# Patient Record
Sex: Female | Born: 1955 | Race: White | Hispanic: No | Marital: Married | Smoking: Never smoker
Health system: Southern US, Community
[De-identification: ages and names within clinical notes are randomized; demographics above are authoritative.]

## PROBLEM LIST (undated history)

## (undated) DIAGNOSIS — C801 Malignant (primary) neoplasm, unspecified: Secondary | ICD-10-CM

## (undated) DIAGNOSIS — N6019 Diffuse cystic mastopathy of unspecified breast: Secondary | ICD-10-CM

## (undated) DIAGNOSIS — Z1211 Encounter for screening for malignant neoplasm of colon: Secondary | ICD-10-CM

## (undated) DIAGNOSIS — Z87440 Personal history of urinary (tract) infections: Secondary | ICD-10-CM

## (undated) HISTORY — DX: Diffuse cystic mastopathy of unspecified breast: N60.19

## (undated) HISTORY — DX: Encounter for screening for malignant neoplasm of colon: Z12.11

## (undated) HISTORY — DX: Personal history of urinary (tract) infections: Z87.440

## (undated) HISTORY — PX: APPENDECTOMY: SHX54

## (undated) HISTORY — PX: BREAST CYST ASPIRATION: SHX578

---

## 2004-03-03 DIAGNOSIS — Z87440 Personal history of urinary (tract) infections: Secondary | ICD-10-CM

## 2004-03-03 HISTORY — DX: Personal history of urinary (tract) infections: Z87.440

## 2004-09-25 ENCOUNTER — Ambulatory Visit: Payer: Self-pay | Admitting: Unknown Physician Specialty

## 2005-06-30 ENCOUNTER — Ambulatory Visit: Payer: Self-pay | Admitting: General Surgery

## 2006-08-05 ENCOUNTER — Ambulatory Visit: Payer: Self-pay | Admitting: General Surgery

## 2007-03-04 HISTORY — PX: COLONOSCOPY: SHX174

## 2007-03-04 HISTORY — PX: CERVICAL CONE BIOPSY: SUR198

## 2007-03-04 HISTORY — PX: TOE SURGERY: SHX1073

## 2007-05-02 ENCOUNTER — Ambulatory Visit: Payer: Self-pay | Admitting: Gynecologic Oncology

## 2007-05-11 ENCOUNTER — Ambulatory Visit: Payer: Self-pay | Admitting: Gynecologic Oncology

## 2007-06-29 ENCOUNTER — Other Ambulatory Visit: Payer: Self-pay

## 2007-06-29 ENCOUNTER — Ambulatory Visit: Payer: Self-pay | Admitting: Gynecologic Oncology

## 2007-07-02 ENCOUNTER — Ambulatory Visit: Payer: Self-pay | Admitting: Gynecologic Oncology

## 2007-08-17 ENCOUNTER — Ambulatory Visit: Payer: Self-pay | Admitting: Gynecologic Oncology

## 2008-02-01 ENCOUNTER — Ambulatory Visit: Payer: Self-pay | Admitting: Gastroenterology

## 2008-06-07 IMAGING — CR DG CHEST 2V
1 series · 2 of 2 positions shown · non-contrast
Comparison: none

REASON FOR EXAM: irregular heart rate
COMMENTS:

PROCEDURE:     DXR - DXR CHEST PA (OR AP) AND LATERAL  - June 29, 2007  [DATE]
RESULT:     The lungs are mildly hyperinflated. There is no focal
infiltrate. The heart is not enlarged and the pulmonary vascularity is not
engorged. There is no pleural effusion.

[Series 1: view not recorded · 0.17mm/px · 2 of 2 slices shown]
[im 1/2]
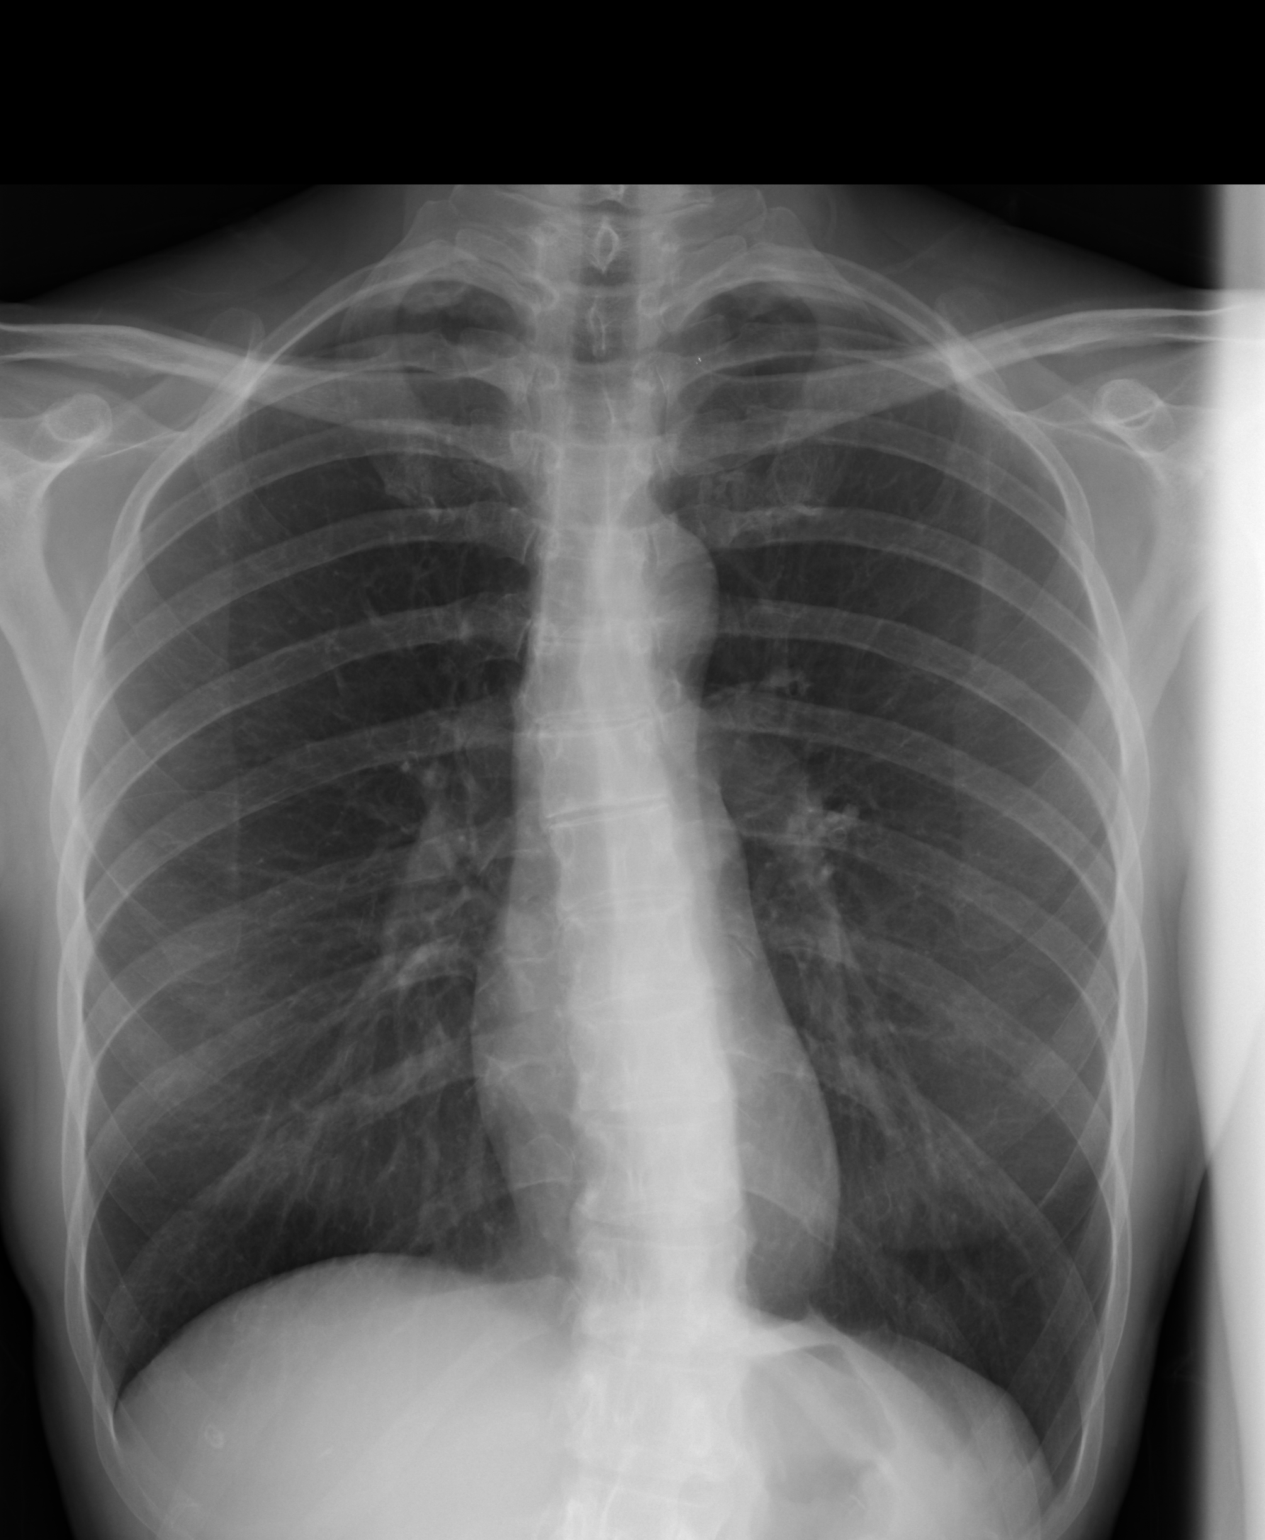
[im 2/2]
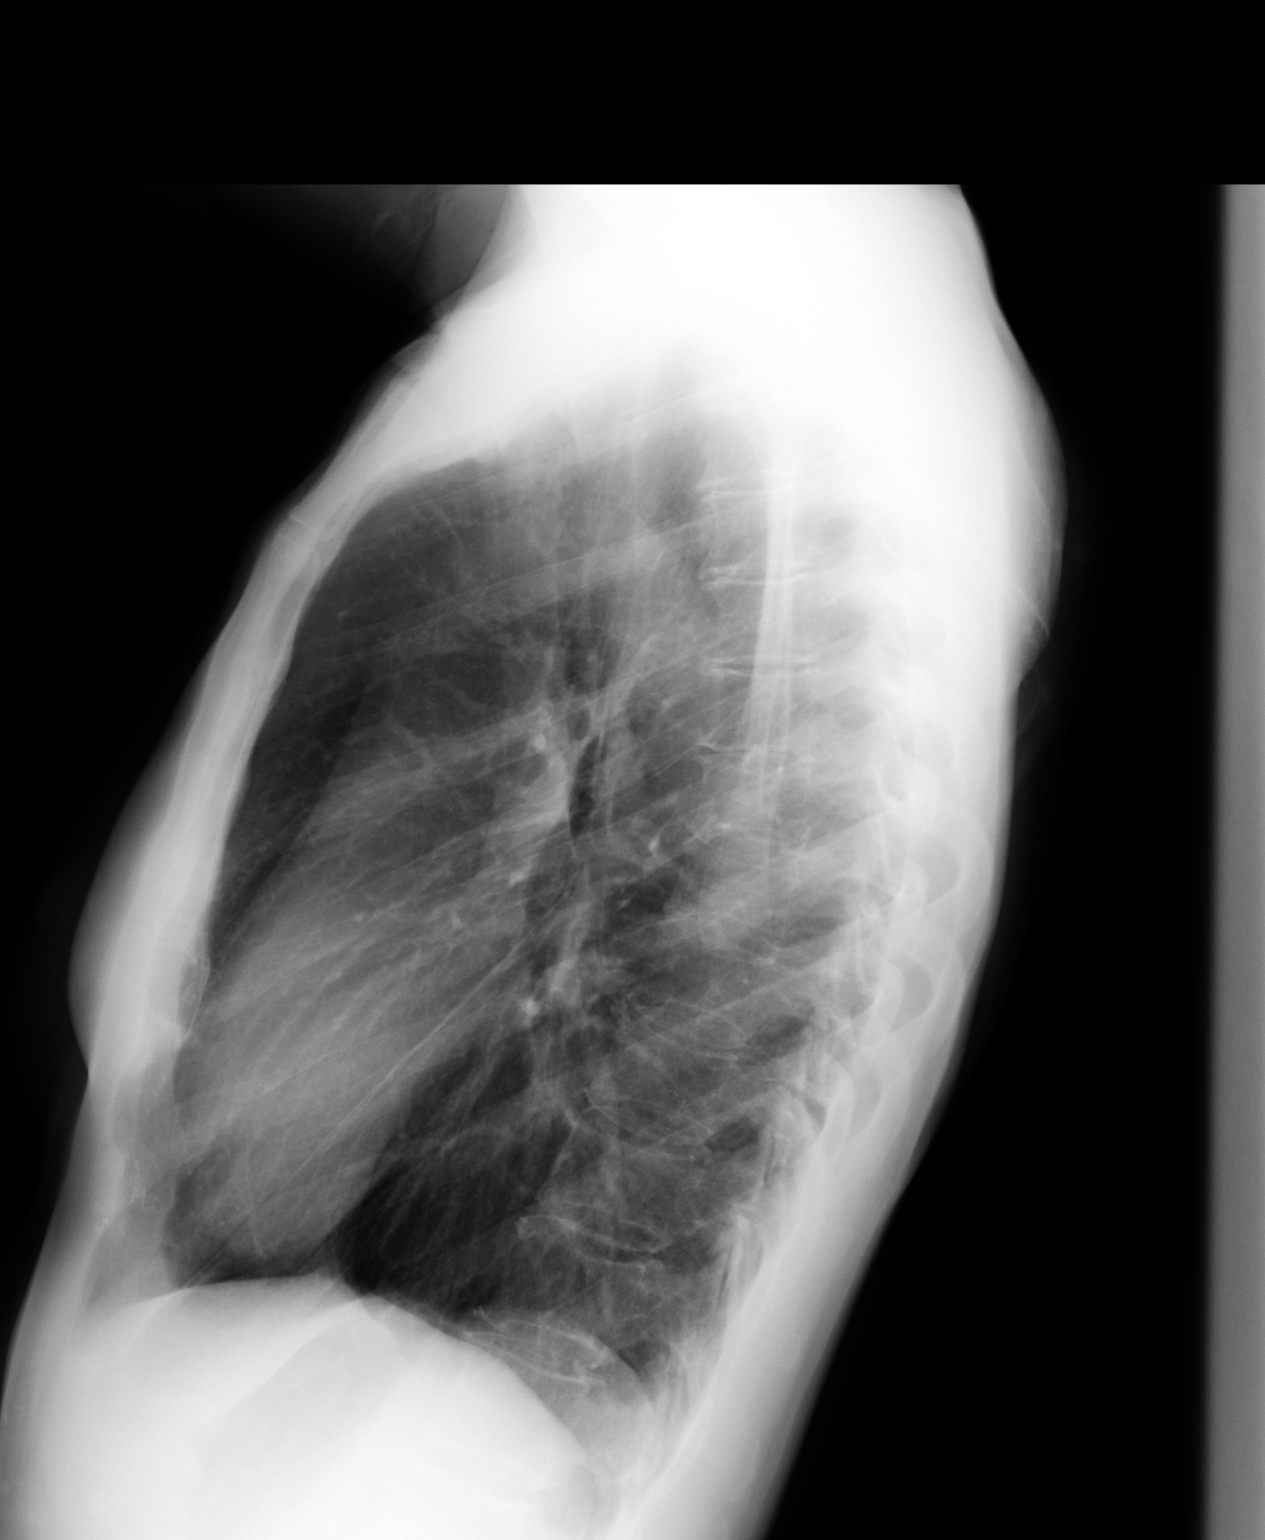

[2 of 2 positions shown; findings below may reference images not displayed]

IMPRESSION: There is hyperinflation consistent with COPD. I see no evidence of CHF nor
pneumonia.

## 2008-06-13 ENCOUNTER — Ambulatory Visit: Payer: Self-pay | Admitting: Obstetrics and Gynecology

## 2009-12-07 ENCOUNTER — Ambulatory Visit: Payer: Self-pay | Admitting: General Surgery

## 2010-03-03 HISTORY — PX: ROTATOR CUFF REPAIR: SHX139

## 2010-12-09 ENCOUNTER — Ambulatory Visit: Payer: Self-pay | Admitting: General Surgery

## 2011-12-11 ENCOUNTER — Ambulatory Visit: Payer: Self-pay | Admitting: General Surgery

## 2012-08-09 ENCOUNTER — Encounter: Payer: Self-pay | Admitting: *Deleted

## 2012-12-23 ENCOUNTER — Ambulatory Visit: Payer: Self-pay | Admitting: General Surgery

## 2013-01-11 ENCOUNTER — Ambulatory Visit: Payer: Self-pay | Admitting: General Surgery

## 2013-01-12 ENCOUNTER — Encounter: Payer: Self-pay | Admitting: General Surgery

## 2013-01-20 ENCOUNTER — Ambulatory Visit (INDEPENDENT_AMBULATORY_CARE_PROVIDER_SITE_OTHER): Payer: BC Managed Care – PPO | Admitting: General Surgery

## 2013-01-20 ENCOUNTER — Encounter: Payer: Self-pay | Admitting: General Surgery

## 2013-01-20 VITALS — BP 110/80 | HR 72 | Resp 12 | Ht 72.0 in | Wt 142.0 lb

## 2013-01-20 DIAGNOSIS — Z1239 Encounter for other screening for malignant neoplasm of breast: Secondary | ICD-10-CM

## 2013-01-20 DIAGNOSIS — N6019 Diffuse cystic mastopathy of unspecified breast: Secondary | ICD-10-CM

## 2013-01-20 NOTE — Patient Instructions (Signed)
Patient to return in 1 year with bilateral screening mammogram. Patient to continue self breast checks. She is advised to contact our office with any new questions or concerns.  

## 2013-01-20 NOTE — Progress Notes (Signed)
Patient ID: Alice Matthews, female   DOB: 07/21/1955, 57 y.o.   MRN: [id number]  Chief Complaint  Patient presents with  . Follow-up    1 year follow up screening mammogram.    HPI Alice Matthews is a 57 y.o. female who presents for a breast evaluation. The most recent mammogram was done on 01/11/13.  Patient does perform regular self breast checks and gets regular mammograms done.  The patient denies any new breast problems at this time.    HPI  Past Medical History  Diagnosis Date  . Diffuse cystic mastopathy   . H/O cystitis 2006  . Special screening for malignant neoplasms, colon     Past Surgical History  Procedure Laterality Date  . Toe surgery  2009  . Colonoscopy  2009    Dr. Servando Snare  . Cervical cone biopsy  2009  . Appendectomy    . Cesarean section    . Rotator cuff repair Left 2012    Family History  Problem Relation Age of Onset  . Cancer Mother     ulcerative colitis and colon cancer  . Lymphoma Father     non hodgkins lymphoma  . Cancer Maternal Grandmother 65    breast    Social History History  Substance Use Topics  . Smoking status: Never Smoker   . Smokeless tobacco: Not on file  . Alcohol Use: Yes     Comment: occasional glass of wine    No Known Allergies  Current Outpatient Prescriptions  Medication Sig Dispense Refill  . aspirin 81 MG tablet Take 81 mg by mouth daily.      . Cholecalciferol (VITAMIN D PO) Take 1 tablet by mouth daily.      . Cyanocobalamin (VITAMIN B-12 PO) Take 1 tablet by mouth daily.      . Omega-3 Fatty Acids (OMEGA 3 PO) Take 1 capsule by mouth daily.       No current facility-administered medications for this visit.    Review of Systems Review of Systems  Constitutional: Negative.   Respiratory: Negative.   Cardiovascular: Negative.     Blood pressure 110/80, pulse 72, resp. rate 12, height 6' (1.829 m), weight 142 lb (64.411 kg).  Physical Exam Physical Exam  Constitutional: She is oriented to  person, place, and time. She appears well-developed and well-nourished.  Eyes: Conjunctivae are normal. No scleral icterus.  Neck: Neck supple. No thyromegaly present.  Cardiovascular: Normal rate, regular rhythm and normal heart sounds.   No murmur heard. Pulmonary/Chest: Effort normal and breath sounds normal. Right breast exhibits no inverted nipple, no mass, no nipple discharge, no skin change and no tenderness. Left breast exhibits no inverted nipple, no mass, no nipple discharge, no skin change and no tenderness.  Abdominal: Soft. Bowel sounds are normal. There is no tenderness.  Lymphadenopathy:    She has no cervical adenopathy.    She has no axillary adenopathy.  Neurological: She is alert and oriented to person, place, and time.  Skin: Skin is warm and dry.    Data Reviewed Mammogram reviewed. Cat 1  Assessment    Exam stable.  History of FCD, FH of colon CA      Plan    1 yr f/u with bilateral screening mammogram       SANKAR,SEEPLAPUTHUR G 01/20/2013, 10:04 AM

## 2014-01-02 ENCOUNTER — Encounter: Payer: Self-pay | Admitting: General Surgery

## 2014-01-17 ENCOUNTER — Encounter: Payer: Self-pay | Admitting: General Surgery

## 2014-01-25 ENCOUNTER — Encounter: Payer: Self-pay | Admitting: General Surgery

## 2014-01-25 ENCOUNTER — Ambulatory Visit (INDEPENDENT_AMBULATORY_CARE_PROVIDER_SITE_OTHER): Payer: BC Managed Care – PPO | Admitting: General Surgery

## 2014-01-25 VITALS — BP 126/70 | HR 78 | Resp 12 | Ht 72.0 in | Wt 144.0 lb

## 2014-01-25 DIAGNOSIS — N6019 Diffuse cystic mastopathy of unspecified breast: Secondary | ICD-10-CM

## 2014-01-25 DIAGNOSIS — Z800 Family history of malignant neoplasm of digestive organs: Secondary | ICD-10-CM

## 2014-01-25 NOTE — Progress Notes (Signed)
Patient ID: Alice Matthews, female   DOB: 16-Jul-1955, 58 y.o.   MRN: [id number]  Chief Complaint  Patient presents with  . Follow-up    1 year mammogram     HPI Alice Matthews is a 58 y.o. female who presents for a breast evaluation. The most recent mammogram was done on 01/17/14. Patient does perform regular self breast checks and gets regular mammograms done. She states she has some mild left breast tenderness at times that seems to be new.    HPI  Past Medical History  Diagnosis Date  . Diffuse cystic mastopathy   . H/O cystitis 2006  . Special screening for malignant neoplasms, colon     Past Surgical History  Procedure Laterality Date  . Toe surgery  2009  . Colonoscopy  2009    Dr. Allen Norris  . Cervical cone biopsy  2009  . Appendectomy    . Cesarean section    . Rotator cuff repair Left 2012    Family History  Problem Relation Age of Onset  . Cancer Mother     ulcerative colitis and colon cancer  . Lymphoma Father     non hodgkins lymphoma  . Cancer Maternal Grandmother 93    breast  . Cancer Maternal Uncle     melanoma    Social History History  Substance Use Topics  . Smoking status: Never Smoker   . Smokeless tobacco: Not on file  . Alcohol Use: 0.0 oz/week    0 Not specified per week     Comment: occasional glass of wine    No Known Allergies  Current Outpatient Prescriptions  Medication Sig Dispense Refill  . aspirin 81 MG tablet Take 81 mg by mouth daily.    . Cholecalciferol (VITAMIN D PO) Take 1 tablet by mouth daily.    . Cyanocobalamin (VITAMIN B-12 PO) Take 1 tablet by mouth daily.    . Omega-3 Fatty Acids (OMEGA 3 PO) Take 1 capsule by mouth daily.     No current facility-administered medications for this visit.    Review of Systems Review of Systems  Constitutional: Negative.   Respiratory: Negative.   Cardiovascular: Negative.     Blood pressure 126/70, pulse 78, resp. rate 12, height 6' (1.829 m), weight 144 lb (65.318  kg).  Physical Exam Physical Exam  Constitutional: She is oriented to person, place, and time. She appears well-developed and well-nourished.  Eyes: Conjunctivae are normal. No scleral icterus.  Neck: Neck supple. No thyromegaly present.  Cardiovascular: Normal rate, regular rhythm and normal heart sounds.   No murmur heard. Pulmonary/Chest: Effort normal and breath sounds normal. Right breast exhibits no inverted nipple, no mass, no nipple discharge, no skin change and no tenderness. Left breast exhibits no inverted nipple, no mass, no nipple discharge, no skin change and no tenderness.  Abdominal: Soft. Normal appearance and bowel sounds are normal. There is no hepatosplenomegaly. There is no tenderness. No hernia.  Lymphadenopathy:    She has no cervical adenopathy.    She has no axillary adenopathy.  Neurological: She is alert and oriented to person, place, and time.  Skin: Skin is warm and dry.    Data Reviewed Mammogram reviewed and stable.   Assessment    Stable exam. FCD. FH of colon ca- pt over due for surveillance colonoscopy. She is aware of it and plans to get it done next yr.    Plan    Patient to return in 1 year with a bilateral  screening mammogram.        SANKAR,SEEPLAPUTHUR G 01/25/2014, 10:29 AM

## 2014-01-25 NOTE — Patient Instructions (Signed)
Patient to return in 1 year with a bilateral screening mammogram. Continue self breast exams. Call office for any new breast issues or concerns.

## 2015-01-31 ENCOUNTER — Other Ambulatory Visit: Payer: Self-pay | Admitting: General Surgery

## 2015-01-31 DIAGNOSIS — Z1231 Encounter for screening mammogram for malignant neoplasm of breast: Secondary | ICD-10-CM

## 2015-02-01 ENCOUNTER — Ambulatory Visit: Payer: Self-pay | Admitting: General Surgery

## 2015-03-13 ENCOUNTER — Encounter: Payer: Self-pay | Admitting: *Deleted

## 2015-06-01 ENCOUNTER — Other Ambulatory Visit: Payer: Self-pay | Admitting: Obstetrics and Gynecology

## 2015-06-01 DIAGNOSIS — Z13820 Encounter for screening for osteoporosis: Secondary | ICD-10-CM

## 2015-06-08 ENCOUNTER — Telehealth: Payer: Self-pay | Admitting: Gastroenterology

## 2015-06-08 ENCOUNTER — Ambulatory Visit
Admission: RE | Admit: 2015-06-08 | Discharge: 2015-06-08 | Disposition: A | Discharge status: Home / Self Care | Payer: BLUE CROSS/BLUE SHIELD | Source: Ambulatory Visit | Attending: General Surgery | Admitting: General Surgery

## 2015-06-08 DIAGNOSIS — Z1231 Encounter for screening mammogram for malignant neoplasm of breast: Secondary | ICD-10-CM | POA: Diagnosis not present

## 2015-06-08 HISTORY — DX: Malignant (primary) neoplasm, unspecified: C80.1

## 2015-06-08 NOTE — Telephone Encounter (Signed)
Colonoscopy There is a note on referral that states the patient wants you to confirm her [employer] is in network

## 2015-06-26 ENCOUNTER — Other Ambulatory Visit: Payer: Self-pay

## 2015-06-26 NOTE — Telephone Encounter (Signed)
Pt stated she would like to think about scheduling. She is not quite ready at the moment. Will call back.

## 2016-12-30 ENCOUNTER — Other Ambulatory Visit: Payer: Self-pay | Admitting: Obstetrics and Gynecology

## 2017-01-06 ENCOUNTER — Other Ambulatory Visit: Payer: Self-pay | Admitting: Obstetrics and Gynecology

## 2017-01-06 DIAGNOSIS — Z1231 Encounter for screening mammogram for malignant neoplasm of breast: Secondary | ICD-10-CM

## 2017-01-08 ENCOUNTER — Ambulatory Visit
Admission: RE | Admit: 2017-01-08 | Discharge: 2017-01-08 | Disposition: A | Discharge status: Home / Self Care | Payer: BLUE CROSS/BLUE SHIELD | Source: Ambulatory Visit | Attending: Obstetrics and Gynecology | Admitting: Obstetrics and Gynecology

## 2017-01-08 DIAGNOSIS — Z1231 Encounter for screening mammogram for malignant neoplasm of breast: Secondary | ICD-10-CM | POA: Diagnosis not present

## 2018-01-19 ENCOUNTER — Other Ambulatory Visit: Payer: Self-pay | Admitting: Obstetrics and Gynecology

## 2018-01-19 DIAGNOSIS — Z1231 Encounter for screening mammogram for malignant neoplasm of breast: Secondary | ICD-10-CM

## 2018-01-21 ENCOUNTER — Ambulatory Visit
Admission: RE | Admit: 2018-01-21 | Discharge: 2018-01-21 | Disposition: A | Discharge status: Home / Self Care | Payer: BLUE CROSS/BLUE SHIELD | Source: Ambulatory Visit | Attending: Obstetrics and Gynecology | Admitting: Obstetrics and Gynecology

## 2018-01-21 DIAGNOSIS — Z1231 Encounter for screening mammogram for malignant neoplasm of breast: Secondary | ICD-10-CM | POA: Diagnosis not present

## 2018-03-22 ENCOUNTER — Encounter: Payer: Self-pay | Admitting: *Deleted

## 2019-01-07 ENCOUNTER — Other Ambulatory Visit: Payer: Self-pay | Admitting: Obstetrics and Gynecology

## 2019-02-15 ENCOUNTER — Other Ambulatory Visit: Payer: Self-pay | Admitting: Obstetrics and Gynecology

## 2019-03-03 ENCOUNTER — Other Ambulatory Visit: Payer: Self-pay | Admitting: Obstetrics and Gynecology

## 2019-03-03 DIAGNOSIS — Z1231 Encounter for screening mammogram for malignant neoplasm of breast: Secondary | ICD-10-CM

## 2019-03-03 DIAGNOSIS — N6459 Other signs and symptoms in breast: Secondary | ICD-10-CM

## 2019-03-11 ENCOUNTER — Ambulatory Visit
Admission: RE | Admit: 2019-03-11 | Discharge: 2019-03-11 | Disposition: A | Discharge status: Home / Self Care | Payer: 59 | Source: Ambulatory Visit | Attending: Obstetrics and Gynecology | Admitting: Obstetrics and Gynecology

## 2019-03-11 DIAGNOSIS — Z1231 Encounter for screening mammogram for malignant neoplasm of breast: Secondary | ICD-10-CM

## 2019-03-11 DIAGNOSIS — N6459 Other signs and symptoms in breast: Secondary | ICD-10-CM

## 2020-02-18 IMAGING — MG DIGITAL DIAGNOSTIC BILAT W/ TOMO W/ CAD
6 of 10 series · 6 of 30 positions shown · non-contrast
Comparison: Previous exam(s).

CLINICAL DATA: 63-year-old patient presents for annual examination
and evaluation the left breast. She reports a history dating back to
at least 9791 of intermittent crusting of the left nipple and
adjacent proximal superior areola. The white crusty areas sometimes
fall off with manipulation, leaving tender slightly erythematous
skin in its place. She has noticed mild erythema of the left nipple
and adjacent superior areola. She states that she saw her
dermatologist in 9791, but at that time the skin findings had
resolved. The patient states that over time, the left nipple/areola
changes have become more persistent, i.e. they have not completely
resolved since sometime in 9791. Thickening was also palpated in the
left breast in the 3-5 o'clock position.

EXAM:
DIGITAL DIAGNOSTIC BILATERAL MAMMOGRAM WITH CAD AND TOMO
ULTRASOUND LEFT BREAST

[L MLO synth-2D (1 of 2)]
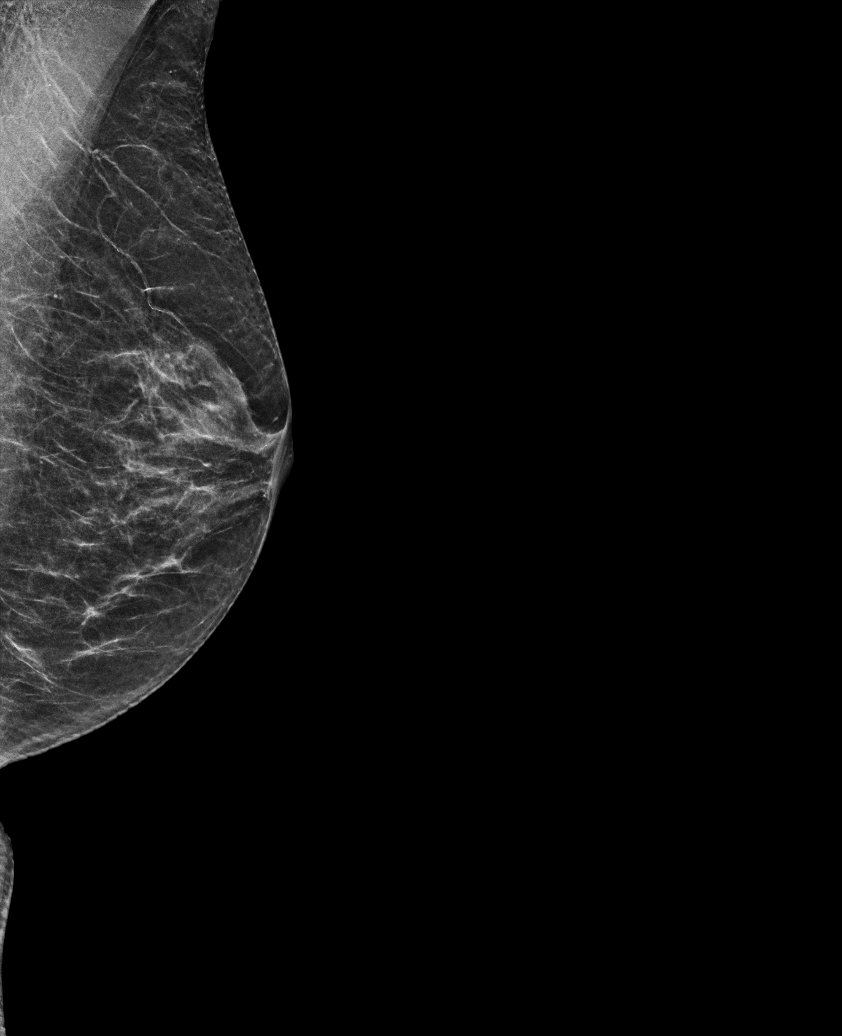

[L MLO synth-2D (2 of 2)]
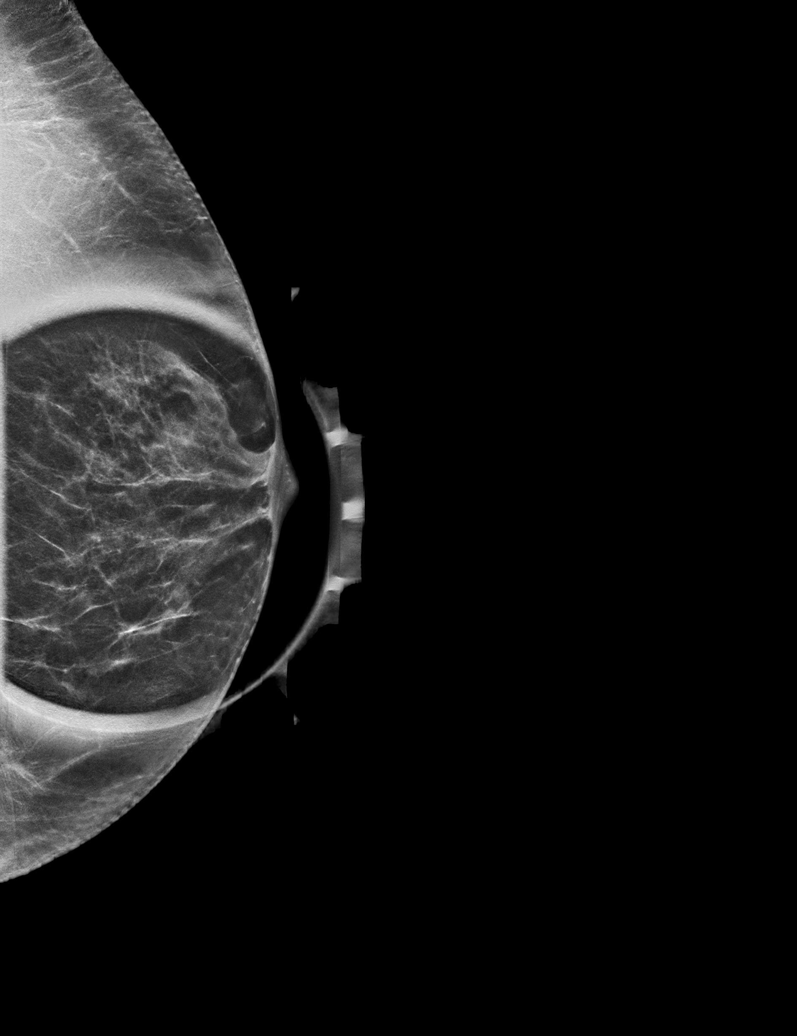

[R MLO synth-2D]
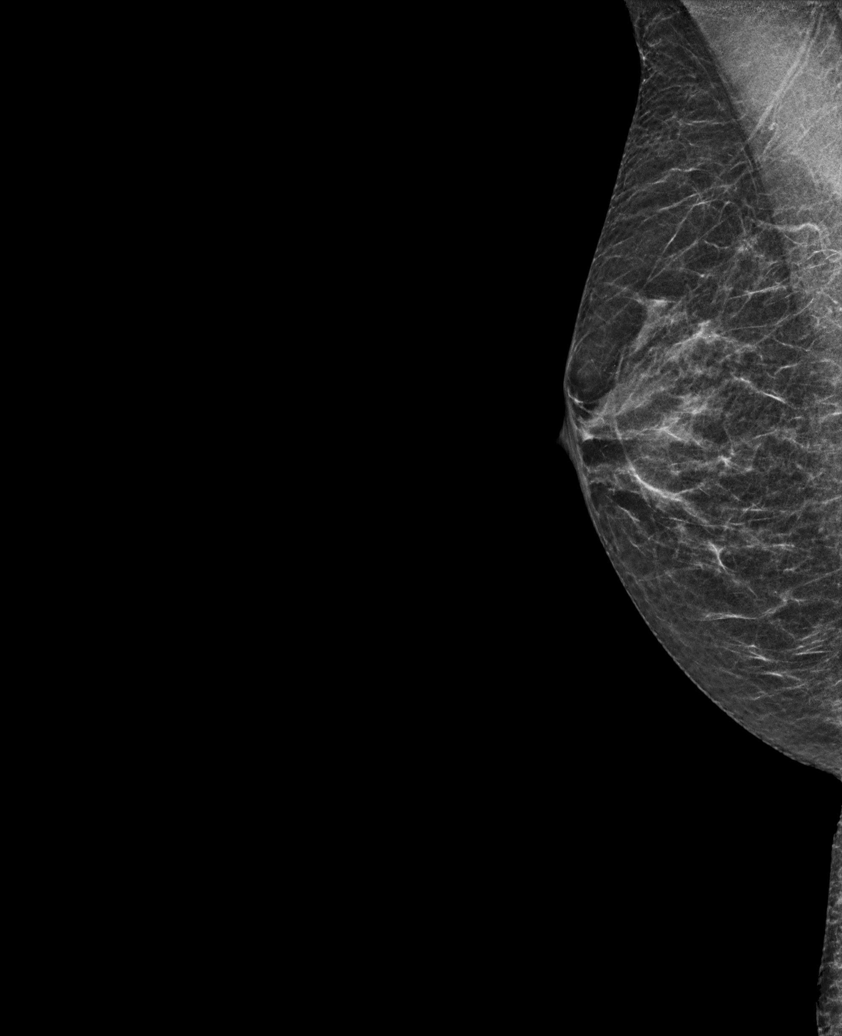

[R CC synth-2D]
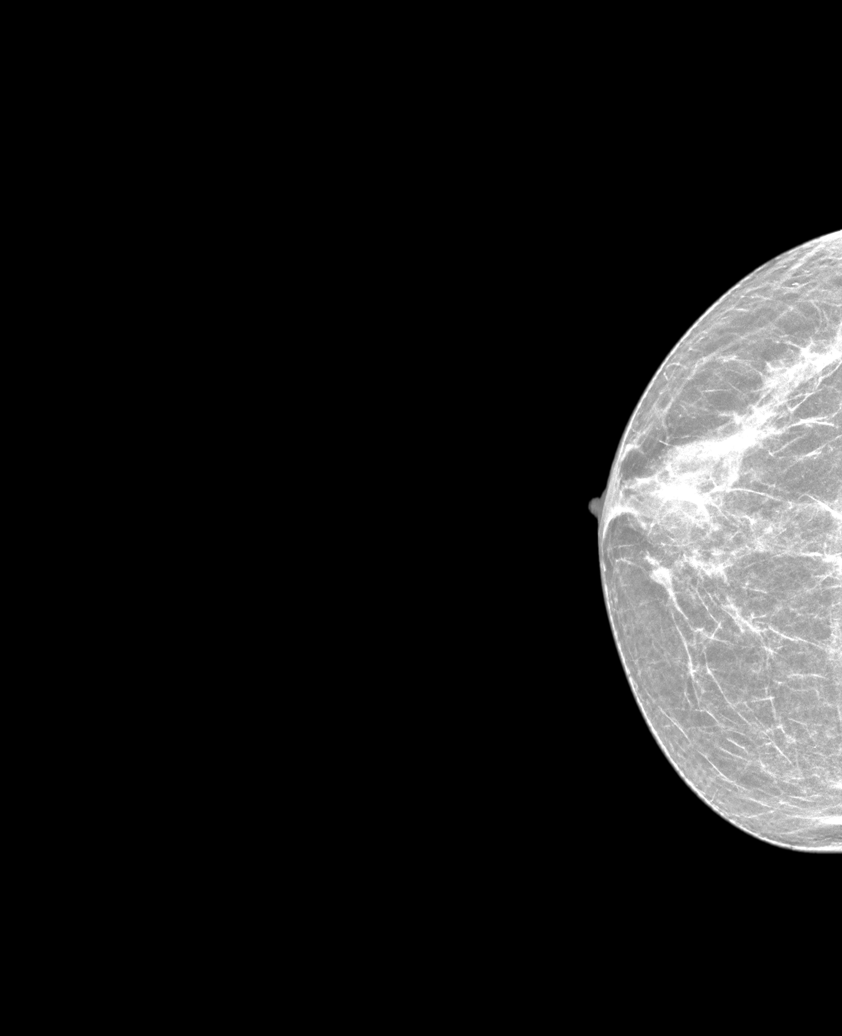

[L CC synth-2D]
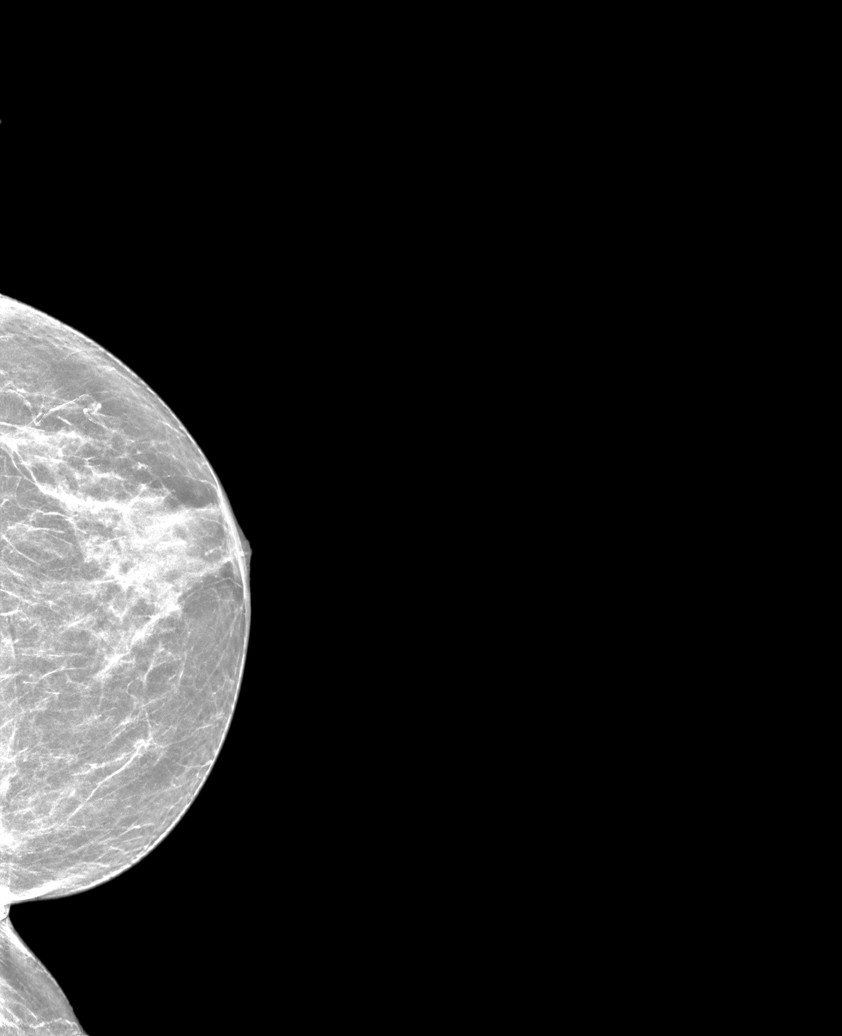

[R MLO tomo · tomo slice 23/44.0]
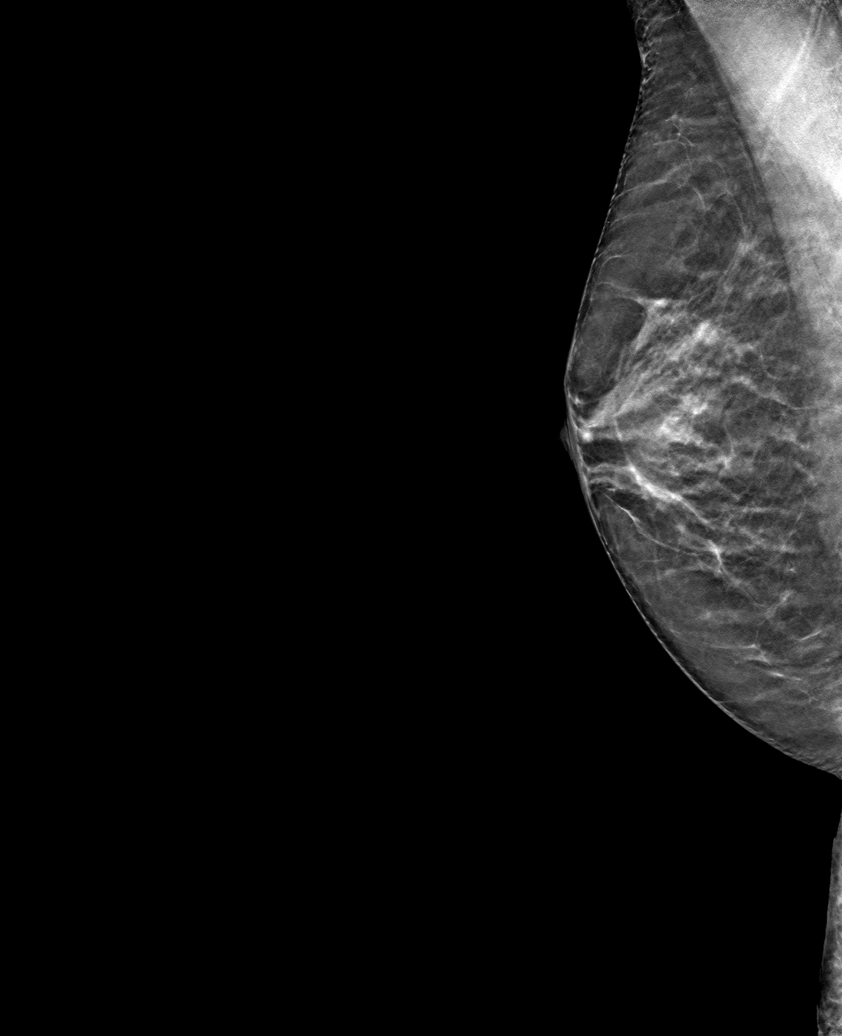

[6 of 30 positions shown; findings below may reference images not displayed]

ACR Breast Density Category c: The breast tissue is heterogeneously
dense, which may obscure small masses.
FINDINGS: No mass, architectural distortion, or suspicious microcalcification
is identified to suggest malignancy in either breast. Normal skin
thickness bilaterally. Imaged axillary regions are unremarkable.

Mammographic images were processed with CAD.

On physical exam, there is mild erythema of the left nipple and 11-1
o'clock positions of the left areola near the nipple base. There are
several whitish raised areas associated with the left nipple/areola.
These of the areas at the patient says are intermittent, they come
and go. I do not palpate a mass in the left breast or left axilla.

Targeted ultrasound is performed, showing a normal sonographic
appearance of the retroareolar left breast and the 3-5 o'clock
positions of the left breast. No solid or cystic mass or abnormal
shadowing is identified. Ultrasound of the left axilla is negative
for lymphadenopathy.
IMPRESSION: Persistent skin changes of the nipple and superior left areola, for
which Paget's disease (ductal carcinoma in situ involving the
nipple) cannot be excluded. Imaging findings of both breasts are
negative, but physical exam findings of the left nipple/areola
warrant further evaluation.

RECOMMENDATION:
Follow-up appointment with the patient's dermatologist for skin
punch biopsy of the left nipple is recommended.

Bilateral mammography is recommended in 1 year.

I have discussed the findings and recommendations with the patient.
If applicable, a reminder letter will be sent to the patient
regarding the next appointment.

BI-RADS CATEGORY  4: Suspicious.
